# Patient Record
Sex: Male | Born: 1935 | Race: White | Hispanic: No | Marital: Married | Smoking: Never smoker
Health system: Southern US, Community
[De-identification: ages and names within clinical notes are randomized; demographics above are authoritative.]

## PROBLEM LIST (undated history)

## (undated) DIAGNOSIS — N400 Enlarged prostate without lower urinary tract symptoms: Secondary | ICD-10-CM

## (undated) DIAGNOSIS — H409 Unspecified glaucoma: Secondary | ICD-10-CM

## (undated) DIAGNOSIS — E079 Disorder of thyroid, unspecified: Secondary | ICD-10-CM

## (undated) DIAGNOSIS — I509 Heart failure, unspecified: Secondary | ICD-10-CM

## (undated) HISTORY — PX: THORACENTESIS: SHX235

## (undated) HISTORY — PX: KNEE ARTHROSCOPY: SUR90

## (undated) HISTORY — PX: HERNIA REPAIR: SHX51

---

## 2014-12-24 ENCOUNTER — Encounter: Payer: Self-pay | Admitting: *Deleted

## 2014-12-24 ENCOUNTER — Ambulatory Visit
Admission: EM | Admit: 2014-12-24 | Discharge: 2014-12-24 | Disposition: A | Discharge status: Home / Self Care | Payer: Medicare Other | Attending: Family Medicine | Admitting: Family Medicine

## 2014-12-24 ENCOUNTER — Ambulatory Visit: Payer: Medicare Other

## 2014-12-24 DIAGNOSIS — J40 Bronchitis, not specified as acute or chronic: Secondary | ICD-10-CM

## 2014-12-24 DIAGNOSIS — E079 Disorder of thyroid, unspecified: Secondary | ICD-10-CM | POA: Insufficient documentation

## 2014-12-24 DIAGNOSIS — R05 Cough: Secondary | ICD-10-CM | POA: Diagnosis present

## 2014-12-24 DIAGNOSIS — N400 Enlarged prostate without lower urinary tract symptoms: Secondary | ICD-10-CM | POA: Diagnosis not present

## 2014-12-24 DIAGNOSIS — I509 Heart failure, unspecified: Secondary | ICD-10-CM | POA: Insufficient documentation

## 2014-12-24 HISTORY — DX: Benign prostatic hyperplasia without lower urinary tract symptoms: N40.0

## 2014-12-24 HISTORY — DX: Disorder of thyroid, unspecified: E07.9

## 2014-12-24 HISTORY — DX: Unspecified glaucoma: H40.9

## 2014-12-24 HISTORY — DX: Heart failure, unspecified: I50.9

## 2014-12-24 MED ORDER — BENZONATATE 100 MG PO CAPS: 100.0000 mg | ORAL_CAPSULE | Freq: Three times a day (TID) | ORAL | Status: AC | PRN

## 2014-12-24 MED ORDER — AZITHROMYCIN 250 MG PO TABS: ORAL_TABLET | ORAL | Status: AC

## 2014-12-24 NOTE — ED Provider Notes (Signed)
[facility]  ____________________________________________  Time seen: Approximately 3:12 PM  I have reviewed the triage vital signs and the nursing notes.   HISTORY  Chief Complaint Cough   HPI Jeff Finley is a 80 y.o. male presents with wife at bedside for the complaints of 4 days of cough. Patient does report some intermittent cough and congestion for a few days prior to that. Patient reports that over the last 3-4 days the cough has been more persistent. Patient states that the cough is primarily a dry cough. Reports some continued runny nose and nasal congestion. Does report recently been around family members with similar.  Denies fevers. Denies chest pain, shortness of breath, abdominal pain, dizziness, weakness, neck or back pain. Reports continues to eat and drink well.  Patient reports that he and his wife are traveling and visiting here from [place]. Patient reports that he did recently have a pneumonia in October and concerned and wanted to make sure that he does not have pneumonia again.   Past Medical History  Diagnosis Date  . BPH (benign prostatic hypertrophy)   . Thyroid disease   . Glaucoma   . CHF (congestive heart failure) (HCC)     There are no active problems to display for this patient.   Past Surgical History  Procedure Laterality Date  . Thoracentesis    . Knee arthroscopy Right   . Hernia repair Left     inguinal    Current Outpatient Rx  Name  Route  Sig  Dispense  Refill  . finasteride (PROSCAR) 5 MG tablet   Oral   Take 5 mg by mouth daily.         Marland Kitchen levothyroxine (SYNTHROID, LEVOTHROID) 88 MCG tablet   Oral   Take 88 mcg by mouth daily before breakfast.         . tamsulosin (FLOMAX) 0.4 MG CAPS capsule   Oral   Take 0.4 mg by mouth daily.           Allergies Review of patient's allergies indicates no known allergies.  History reviewed. No pertinent family history.  Social History Social History  Substance  Use Topics  . Smoking status: Never Smoker   . Smokeless tobacco: Never Used  . Alcohol Use: No    Review of Systems Constitutional: No fever/chills Eyes: No visual changes. ENT: No sore throat. Positive nasal congestion. Positive cough. Cardiovascular: Denies chest pain. Respiratory: Denies shortness of breath. Gastrointestinal: No abdominal pain.  No nausea, no vomiting.  No diarrhea.  No constipation. Genitourinary: Negative for dysuria. Musculoskeletal: Negative for back pain. Skin: Negative for rash. Neurological: Negative for headaches, focal weakness or numbness.  10-point ROS otherwise negative.  ____________________________________________   PHYSICAL EXAM:  VITAL SIGNS: ED Triage Vitals  Enc Vitals Group     BP 12/24/14 1414 140/75 mmHg     Pulse Rate 12/24/14 1414 64     Resp 12/24/14 1414 18     Temp 12/24/14 1414 97.5 F (36.4 C)     Temp Source 12/24/14 1414 Oral     SpO2 12/24/14 1414 99 %     Weight 12/24/14 1414 180 lb (81.647 kg)     Height 12/24/14 1414  (1.854 m)     Head Cir --      Peak Flow --      Pain Score --      Pain Loc --      Pain Edu? --      Excl.  in GC? --     Constitutional: Alert and oriented. Well appearing and in no acute distress. Eyes: Conjunctivae are normal. PERRL. EOMI. Head: Atraumatic. No sinus tenderness to palpation. No erythema. No swelling.  Ears: no erythema, normal TMs bilaterally.   Nose: Nasal congestion, clear rhinorrhea, bilateral nasal turbinate erythema and edema.  Mouth/Throat: Mucous membranes are moist.  Oropharynx non-erythematous. No tonsillar swelling or exudate. Neck: No stridor.  No cervical spine tenderness to palpation. Hematological/Lymphatic/Immunilogical: No cervical lymphadenopathy. Cardiovascular: Normal rate, regular rhythm. Grossly normal heart sounds.  Good peripheral circulation. Respiratory: Normal respiratory effort.  No retractions. Mild scattered rhonchi. Good air movement. No  wheezes or rales. Dry intermittent cough in room. Speaks in complete sentences.   Gastrointestinal: Soft and nontender. Normal Bowel sounds.   Musculoskeletal: No lower or upper extremity tenderness nor edema.  No calf tenderness bilaterally. No cervical, thoracic or lumbar tenderness to palpation.  Neurologic:  Normal speech and language. No gross focal neurologic deficits are appreciated. No gait instability. Skin:  Skin is warm, dry and intact. No rash noted. Psychiatric: Mood and affect are normal. Speech and behavior are normal.  ____________________________________________   LABS (all labs ordered are listed, but only abnormal results are displayed)  Labs Reviewed - No data to display ____________________________________________ RADIOLOGY  CHEST 2 VIEW  COMPARISON: None.  FINDINGS: Cardiomediastinal silhouette is unremarkable. No acute infiltrate or pulmonary edema. Mild thoracic dextroscoliosis. Mild degenerative changes thoracic spine. Streaky mild bilateral basilar atelectasis or scarring.  IMPRESSION: No infiltrate or pulmonary edema. Streaky bilateral basilar atelectasis or scarring. Mild degenerative change thoracic spine.   Electronically Signed By: Natasha MeadLiviu Pop M.D. On: 12/24/2014 15:23  I, Renford DillsLindsey Bodhi Moradi, personally viewed and evaluated these images (plain radiographs) as part of my medical decision making.   ________________________________________   INITIAL IMPRESSION / ASSESSMENT AND PLAN / ED COURSE  Pertinent labs & imaging results that were available during my care of the patient were reviewed by me and considered in my medical decision making (see chart for details).  Very well-appearing patient. No acute distress. Presents for the complaints of several days of runny nose, nasal congestion and cough. Reports the last 3-4 days cough has increased. Denies wheezing, shortness of breath or chest pain. Reports continues to eat and drink well. Moist  mucous membranes. Mild scattered rhonchi. No area of consolidation auscultated. No wheezes or rales. Very well-appearing patient. Will evaluate chest x-ray. Suspect bronchitis.  Chest x-ray no infiltrate or pulmonary edema, streaky bilateral basilar atelectasis or scarring, mild degenerative changes thoracic spine per radiologist.  Discussed with patient and family likely bronchitis is viral, discussed use of antibiotics. Discussed will give prescription for azithromycin to start and 1-2 days if continues with cough and congestion. Tessalon Perles as needed for cough. Encouraged rest, fluids and monitoring symptoms closely.  Discussed follow up with Primary care physician this week. Discussed follow up and return parameters including no resolution or any worsening concerns. Patient verbalized understanding and agreed to plan.   ____________________________________________   FINAL CLINICAL IMPRESSION(S) / ED DIAGNOSES  Final diagnoses:  Bronchitis       Renford DillsLindsey Zacary Bauer, NP 12/24/14 1551

## 2014-12-24 NOTE — ED Notes (Signed)
Patient reports severe cough that started three days ago. No other symptoms reported. Patient had a case of pneumonia early this year which required 2 courses of antibiotics to treat. Patient does not report chest pain.

## 2014-12-24 NOTE — Discharge Instructions (Signed)
Take medication as prescribed. Rest. Eat and drink regularly. Follow up with your primary care physician as needed.   Return to Urgent care for new or worsening concerns.

## 2017-02-15 IMAGING — CR DG CHEST 2V
2 series · 2 of 2 positions shown · non-contrast
Comparison: None.

CLINICAL DATA: Nonproductive cough for 4 days, recent pneumonia

EXAM:
CHEST  2 VIEW

[chest pa]
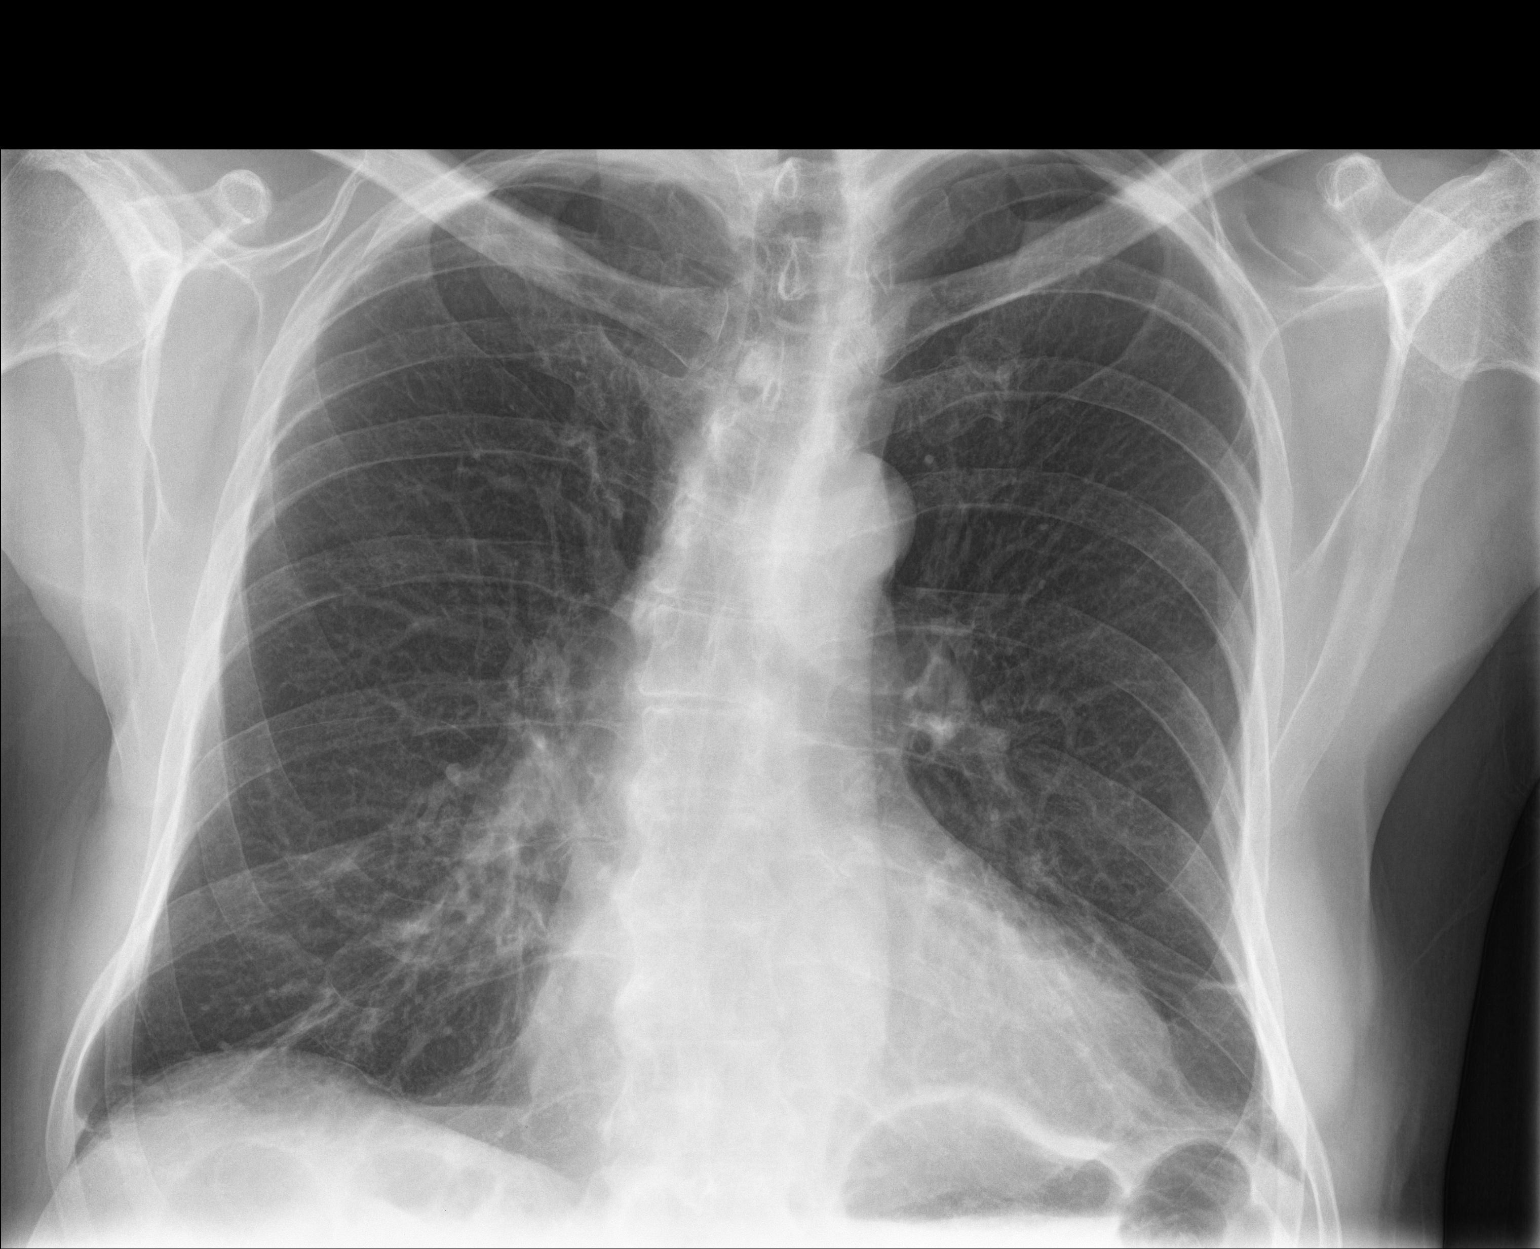

[chest lat]
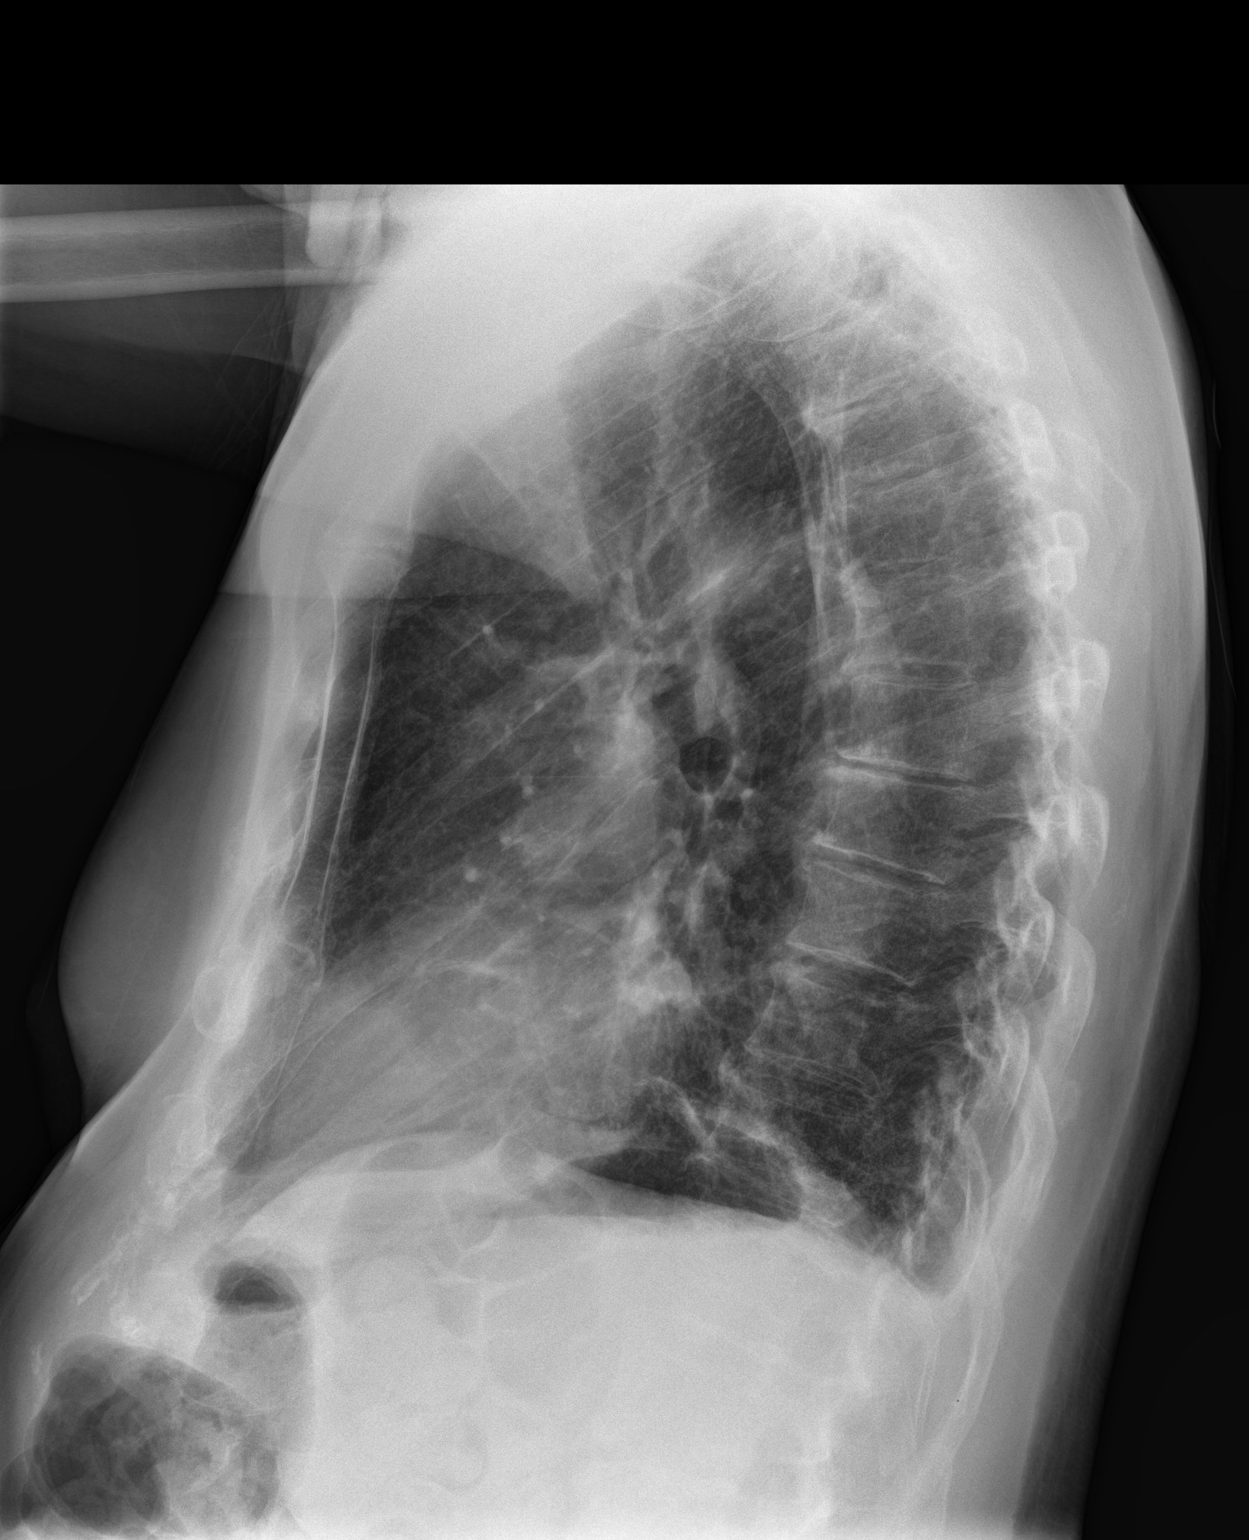

[2 of 2 positions shown; findings below may reference images not displayed]

FINDINGS: Cardiomediastinal silhouette is unremarkable. No acute infiltrate or
pulmonary edema. Mild thoracic dextroscoliosis. Mild degenerative
changes thoracic spine. Streaky mild bilateral basilar atelectasis
or scarring.
IMPRESSION: No infiltrate or pulmonary edema. Streaky bilateral basilar
atelectasis or scarring. Mild degenerative change thoracic spine.
